# Patient Record
Sex: Male | Born: 1955 | Race: White | Hispanic: No | Marital: Single
Health system: Southern US, Community
[De-identification: ages and names within clinical notes are randomized; demographics above are authoritative.]

---

## 2008-03-10 ENCOUNTER — Emergency Department: Payer: Self-pay | Admitting: Emergency Medicine

## 2009-08-07 ENCOUNTER — Emergency Department: Payer: Self-pay | Admitting: Emergency Medicine

## 2009-08-23 ENCOUNTER — Emergency Department: Payer: Self-pay | Admitting: Emergency Medicine

## 2010-05-17 ENCOUNTER — Ambulatory Visit: Payer: Self-pay | Admitting: Family Medicine

## 2010-08-16 ENCOUNTER — Ambulatory Visit: Payer: Self-pay | Admitting: Family Medicine

## 2012-07-02 ENCOUNTER — Ambulatory Visit: Payer: Self-pay | Admitting: Family Medicine

## 2012-07-24 ENCOUNTER — Ambulatory Visit: Payer: Self-pay | Admitting: Specialist

## 2012-08-06 ENCOUNTER — Ambulatory Visit: Payer: Self-pay | Admitting: Specialist

## 2012-08-06 LAB — PROTIME-INR: Prothrombin Time: 13.3 secs (ref 11.5–14.7)

## 2012-08-06 LAB — PLATELET COUNT: Platelet: 513 10*3/uL — ABNORMAL HIGH (ref 150–440)

## 2012-08-07 LAB — PATHOLOGY REPORT

## 2012-08-17 ENCOUNTER — Ambulatory Visit: Payer: Self-pay | Admitting: Internal Medicine

## 2012-08-27 ENCOUNTER — Ambulatory Visit: Payer: Self-pay | Admitting: Hematology and Oncology

## 2012-08-28 ENCOUNTER — Ambulatory Visit: Payer: Self-pay | Admitting: Hematology and Oncology

## 2012-09-02 ENCOUNTER — Ambulatory Visit: Payer: Self-pay | Admitting: Hematology and Oncology

## 2012-09-09 LAB — COMPREHENSIVE METABOLIC PANEL
Alkaline Phosphatase: 118 U/L (ref 50–136)
Anion Gap: 7 (ref 7–16)
BUN: 16 mg/dL (ref 7–18)
Bilirubin,Total: 0.3 mg/dL (ref 0.2–1.0)
Calcium, Total: 9.2 mg/dL (ref 8.5–10.1)
Co2: 29 mmol/L (ref 21–32)
Creatinine: 1.07 mg/dL (ref 0.60–1.30)
EGFR (African American): >60
EGFR (Non-African Amer.): >60
Glucose: 98 mg/dL (ref 65–99)
Osmolality: 275 (ref 275–301)
Sodium: 137 mmol/L (ref 136–145)
Total Protein: 7.9 g/dL (ref 6.4–8.2)

## 2012-09-09 LAB — CBC CANCER CENTER
Eosinophil #: 0.1 x10 3/mm (ref 0.0–0.7)
Eosinophil %: 0.6 %
HCT: 29.7 % — ABNORMAL LOW (ref 40.0–52.0)
HGB: 10 g/dL — ABNORMAL LOW (ref 13.0–18.0)
Lymphocyte #: 1.1 x10 3/mm (ref 1.0–3.6)
MCH: 26.4 pg (ref 26.0–34.0)
MCV: 78 fL — ABNORMAL LOW (ref 80–100)
Monocyte %: 12.5 %
Neutrophil #: 8 x10 3/mm — ABNORMAL HIGH (ref 1.4–6.5)
Neutrophil %: 75.4 %
RDW: 19.2 % — ABNORMAL HIGH (ref 11.5–14.5)
WBC: 10.6 x10 3/mm (ref 3.8–10.6)

## 2012-09-16 LAB — CBC CANCER CENTER
Basophil %: 0.9 %
Eosinophil #: 0.1 x10 3/mm (ref 0.0–0.7)
HCT: 32.2 % — ABNORMAL LOW (ref 40.0–52.0)
HGB: 10.6 g/dL — ABNORMAL LOW (ref 13.0–18.0)
Lymphocyte %: 14.6 %
MCH: 26 pg (ref 26.0–34.0)
MCV: 79 fL — ABNORMAL LOW (ref 80–100)
Monocyte #: 1.2 x10 3/mm — ABNORMAL HIGH (ref 0.2–1.0)
Monocyte %: 13.1 %
Platelet: 552 x10 3/mm — ABNORMAL HIGH (ref 150–440)
RBC: 4.07 10*6/uL — ABNORMAL LOW (ref 4.40–5.90)

## 2012-09-16 LAB — BASIC METABOLIC PANEL
BUN: 15 mg/dL (ref 7–18)
Calcium, Total: 9 mg/dL (ref 8.5–10.1)
Co2: 30 mmol/L (ref 21–32)
Creatinine: 1.02 mg/dL (ref 0.60–1.30)
EGFR (Non-African Amer.): >60
Glucose: 80 mg/dL (ref 65–99)
Osmolality: 277 (ref 275–301)
Potassium: 3.8 mmol/L (ref 3.5–5.1)

## 2012-09-18 ENCOUNTER — Ambulatory Visit: Payer: Self-pay | Admitting: Hematology and Oncology

## 2012-09-23 LAB — CBC CANCER CENTER
Basophil #: 0.1 x10 3/mm (ref 0.0–0.1)
Basophil %: 1.1 %
Eosinophil %: 2.5 %
HGB: 10.5 g/dL — ABNORMAL LOW (ref 13.0–18.0)
Lymphocyte %: 12.3 %
MCH: 26.6 pg (ref 26.0–34.0)
MCHC: 33.4 g/dL (ref 32.0–36.0)
Monocyte %: 6.3 %
Neutrophil #: 4.3 x10 3/mm (ref 1.4–6.5)
Neutrophil %: 77.8 %
RBC: 3.93 10*6/uL — ABNORMAL LOW (ref 4.40–5.90)

## 2012-09-30 LAB — HEPATIC FUNCTION PANEL A (ARMC)
Albumin: 2.8 g/dL — ABNORMAL LOW (ref 3.4–5.0)
Alkaline Phosphatase: 130 U/L (ref 50–136)
Bilirubin, Direct: 0.1 mg/dL (ref 0.00–0.20)
Bilirubin,Total: 0.3 mg/dL (ref 0.2–1.0)

## 2012-09-30 LAB — BASIC METABOLIC PANEL
Anion Gap: 9 (ref 7–16)
BUN: 21 mg/dL — ABNORMAL HIGH (ref 7–18)
Calcium, Total: 8.9 mg/dL (ref 8.5–10.1)
Chloride: 96 mmol/L — ABNORMAL LOW (ref 98–107)
Co2: 28 mmol/L (ref 21–32)
Creatinine: 1.1 mg/dL (ref 0.60–1.30)
EGFR (African American): >60
Glucose: 109 mg/dL — ABNORMAL HIGH (ref 65–99)
Osmolality: 270 (ref 275–301)
Potassium: 4.2 mmol/L (ref 3.5–5.1)
Sodium: 133 mmol/L — ABNORMAL LOW (ref 136–145)

## 2012-09-30 LAB — CBC CANCER CENTER
Basophil #: 0 x10 3/mm (ref 0.0–0.1)
Eosinophil %: 1.4 %
HCT: 31.3 % — ABNORMAL LOW (ref 40.0–52.0)
MCH: 26.6 pg (ref 26.0–34.0)
MCHC: 33.3 g/dL (ref 32.0–36.0)
MCV: 80 fL (ref 80–100)
Monocyte #: 0.7 x10 3/mm (ref 0.2–1.0)
Monocyte %: 17 %
Neutrophil %: 73.6 %
Platelet: 359 x10 3/mm (ref 150–440)
RBC: 3.91 10*6/uL — ABNORMAL LOW (ref 4.40–5.90)
RDW: 18.3 % — ABNORMAL HIGH (ref 11.5–14.5)
WBC: 4.2 x10 3/mm (ref 3.8–10.6)

## 2012-10-07 LAB — CBC CANCER CENTER
Basophil #: 0 x10 3/mm (ref 0.0–0.1)
Lymphocyte %: 6.7 %
Monocyte #: 0.4 x10 3/mm (ref 0.2–1.0)
Monocyte %: 10.4 %
Neutrophil %: 81.2 %
RDW: 18.7 % — ABNORMAL HIGH (ref 11.5–14.5)
WBC: 3.9 x10 3/mm (ref 3.8–10.6)

## 2012-10-07 LAB — BASIC METABOLIC PANEL
Calcium, Total: 9.3 mg/dL (ref 8.5–10.1)
Chloride: 98 mmol/L (ref 98–107)
Creatinine: 0.94 mg/dL (ref 0.60–1.30)
EGFR (African American): >60
EGFR (Non-African Amer.): >60
Glucose: 113 mg/dL — ABNORMAL HIGH (ref 65–99)
Osmolality: 275 (ref 275–301)
Potassium: 4.3 mmol/L (ref 3.5–5.1)

## 2012-10-14 LAB — CBC CANCER CENTER
Basophil #: 0 x10 3/mm (ref 0.0–0.1)
Basophil %: 1.1 %
Eosinophil #: 0.1 x10 3/mm (ref 0.0–0.7)
Eosinophil %: 1.2 %
HCT: 31 % — ABNORMAL LOW (ref 40.0–52.0)
HGB: 10.3 g/dL — ABNORMAL LOW (ref 13.0–18.0)
MCH: 27 pg (ref 26.0–34.0)
MCHC: 33.3 g/dL (ref 32.0–36.0)
MCV: 81 fL (ref 80–100)
Monocyte %: 18.5 %
Neutrophil #: 3.2 x10 3/mm (ref 1.4–6.5)
Neutrophil %: 73.2 %
Platelet: 340 x10 3/mm (ref 150–440)
RBC: 3.83 10*6/uL — ABNORMAL LOW (ref 4.40–5.90)
RDW: 19.9 % — ABNORMAL HIGH (ref 11.5–14.5)
WBC: 4.3 x10 3/mm (ref 3.8–10.6)

## 2012-10-14 LAB — BASIC METABOLIC PANEL
Anion Gap: 9 (ref 7–16)
BUN: 14 mg/dL (ref 7–18)
Calcium, Total: 8.5 mg/dL (ref 8.5–10.1)
Chloride: 98 mmol/L (ref 98–107)
Co2: 30 mmol/L (ref 21–32)
Creatinine: 0.98 mg/dL (ref 0.60–1.30)
EGFR (African American): >60
Glucose: 79 mg/dL (ref 65–99)
Potassium: 3.8 mmol/L (ref 3.5–5.1)

## 2012-10-19 ENCOUNTER — Ambulatory Visit: Payer: Self-pay | Admitting: Hematology and Oncology

## 2012-10-21 ENCOUNTER — Ambulatory Visit: Payer: Self-pay | Admitting: Hematology and Oncology

## 2012-10-21 LAB — BASIC METABOLIC PANEL
BUN: 19 mg/dL — ABNORMAL HIGH (ref 7–18)
Chloride: 98 mmol/L (ref 98–107)
Co2: 31 mmol/L (ref 21–32)
EGFR (African American): >60
EGFR (Non-African Amer.): >60
Glucose: 108 mg/dL — ABNORMAL HIGH (ref 65–99)
Osmolality: 275 (ref 275–301)
Potassium: 4.1 mmol/L (ref 3.5–5.1)

## 2012-10-21 LAB — CBC CANCER CENTER
Basophil #: 0 x10 3/mm (ref 0.0–0.1)
Basophil %: 0.7 %
HCT: 35.2 % — ABNORMAL LOW (ref 40.0–52.0)
HGB: 11.6 g/dL — ABNORMAL LOW (ref 13.0–18.0)
Lymphocyte #: 0.3 x10 3/mm — ABNORMAL LOW (ref 1.0–3.6)
Lymphocyte %: 5.7 %
MCH: 26.8 pg (ref 26.0–34.0)
MCHC: 32.9 g/dL (ref 32.0–36.0)
Monocyte #: 0.7 x10 3/mm (ref 0.2–1.0)
Monocyte %: 13.8 %
Neutrophil #: 3.9 x10 3/mm (ref 1.4–6.5)
Neutrophil %: 78.8 %
Platelet: 351 x10 3/mm (ref 150–440)
RBC: 4.32 10*6/uL — ABNORMAL LOW (ref 4.40–5.90)
WBC: 5 x10 3/mm (ref 3.8–10.6)

## 2012-10-28 LAB — BASIC METABOLIC PANEL
BUN: 16 mg/dL (ref 7–18)
Calcium, Total: 9 mg/dL (ref 8.5–10.1)
Chloride: 97 mmol/L — ABNORMAL LOW (ref 98–107)
Creatinine: 1.07 mg/dL (ref 0.60–1.30)
EGFR (African American): >60
Glucose: 117 mg/dL — ABNORMAL HIGH (ref 65–99)
Sodium: 133 mmol/L — ABNORMAL LOW (ref 136–145)

## 2012-10-28 LAB — CBC CANCER CENTER
Eosinophil #: 0 x10 3/mm (ref 0.0–0.7)
Lymphocyte #: 0.2 x10 3/mm — ABNORMAL LOW (ref 1.0–3.6)
Lymphocyte %: 6.3 %
Monocyte #: 0.4 x10 3/mm (ref 0.2–1.0)
Neutrophil #: 2 x10 3/mm (ref 1.4–6.5)
Neutrophil %: 74.5 %
WBC: 2.6 x10 3/mm — ABNORMAL LOW (ref 3.8–10.6)

## 2012-11-04 LAB — CBC CANCER CENTER
HCT: 28.6 % — ABNORMAL LOW (ref 40.0–52.0)
HGB: 9.4 g/dL — ABNORMAL LOW (ref 13.0–18.0)
Lymphocyte #: 0.2 x10 3/mm — ABNORMAL LOW (ref 1.0–3.6)
Lymphocyte %: 9 %
MCHC: 32.9 g/dL (ref 32.0–36.0)
MCV: 84 fL (ref 80–100)
Neutrophil #: 1.4 x10 3/mm (ref 1.4–6.5)
Neutrophil %: 67 %
Platelet: 298 x10 3/mm (ref 150–440)
RBC: 3.41 10*6/uL — ABNORMAL LOW (ref 4.40–5.90)
RDW: 21.8 % — ABNORMAL HIGH (ref 11.5–14.5)

## 2012-11-04 LAB — BASIC METABOLIC PANEL
Calcium, Total: 8.6 mg/dL (ref 8.5–10.1)
Chloride: 97 mmol/L — ABNORMAL LOW (ref 98–107)
Co2: 30 mmol/L (ref 21–32)
EGFR (African American): >60
Glucose: 106 mg/dL — ABNORMAL HIGH (ref 65–99)
Sodium: 134 mmol/L — ABNORMAL LOW (ref 136–145)

## 2012-11-13 ENCOUNTER — Emergency Department: Payer: Self-pay | Admitting: Emergency Medicine

## 2012-11-18 ENCOUNTER — Ambulatory Visit: Payer: Self-pay | Admitting: Hematology and Oncology

## 2012-11-18 DEATH — deceased

## 2014-06-10 NOTE — Consult Note (Signed)
Reason for Visit: This 59 year old Male patient presents to the clinic for initial evaluation of  lung cancer .   Referred by Dr.Ramiah.  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  Pathology Report pathology report reviewed   Imaging Report PET/CT scan and CT scans reviewed   Referral Report clinical notes reviewed   Planned Treatment Regimen concurrent chemotherapy and radiation therapy   HPI   patient is a 59 year old male resident of the group home based on schizophrenia and mild mental retardation whose been followed for left lower lobe pneumonia for some time. Recent chest x-ray showed a 7.5 cm left lower lobe mass PET CT was performed showing avid uptake with consolidation of the mid and left lower lobe. Bronchoscopy was performed as well as fine-needle aspirate which was positive for squamous cell carcinoma. He's been evaluated by medical oncology and is now referred to radiation oncology for opinion. He does have been nonproductive cough. He specifically denies hemoptysis. He is having no bone pain.he continues to smoke although he is down to 2 cigarettes per day.  Past Hx:    Squamous cell carcinoma of lung:    COPD:    MR:    Schizophrenia:   Past, Family and Social History:  Past Medical History positive   Respiratory COPD   Neurological/Psychiatric moderate mental retardation, schizophrenia   Family History noncontributory   Social History positive   Social History Comments greater than 40 pack is smoking history although is only down to 2 cigarettes per day. No EtOH abuse history   Additional Past Medical and Surgical History accompanied bymember of his group home as well as to family members today   Allergies:   No Known Allergies:   Home Meds:  Home Medications: Medication Instructions Status  propranolol 20 mg oral tablet 1   orally 3 times a day Active  lorazepam 1 mg oral tablet 1   3 times a day Active  ProAir HFA CFC free 90 mcg/inh inhalation aerosol 2 puff(s) inhaled 4 times a day, As Needed Active  Colace sodium 100 mg oral capsule 1 cap(s) orally 2 times a day Active  benztropine 1 mg oral tablet 1 tab(s) orally 2 times a day Active  fluPHENAZine 2.5 mg/mL injectable solution 25 milligram(s) injectable every 2 weeks Active  mirtazapine 15 mg oral tablet 1 tab(s) orally once a day (at bedtime) Active  SEROquel 400 mg oral tablet 1 tab(s) orally once a day (at bedtime) Active  ibuprofen 600 mg oral tablet 1 tab(s) orally 3 times a day Active  Spiriva 18 mcg inhalation capsule 1 each inhaled once a day Active  fluticasone nasal 50 mcg/inh nasal spray 1 spray(s) nasal once a day Active  cetirizine 10 mg oral tablet 1 tab(s) orally once a day Active   Review of Systems:  General negative   Performance Status (ECOG) 0   Skin negative   Breast negative   Ophthalmologic negative   ENMT negative   Respiratory and Thorax see HPI   Cardiovascular negative   Gastrointestinal negative   Genitourinary negative   Musculoskeletal negative   Neurological negative   Psychiatric negative   Hematology/Lymphatics negative   Endocrine negative   Allergic/Immunologic negative   Nursing Notes:  Nursing Vital Signs and Chemo Nursing Nursing Notes: *CC Vital Signs Flowsheet:   17-Jul-14 14:41  Temp Temperature 97  Pulse Pulse 85  Respirations Respirations 21  SBP SBP 110  DBP DBP 71  Current Weight (kg) (kg) 49.6  Height (cm) centimeters 170.5  BSA (m2) 1.5   Physical Exam:  General/Skin/HEENT:  Skin normal   Eyes normal   ENMT normal   Head and Neck normal   Additional PE well-developed thin male in NAD appears to have mild mental retardation.cervical or subclavicular adenopathy is appreciated. Lungs are clear to A&P cardiac examination shows regular rate and rhythm. Abdomen is benign  with no organomegaly or masses noted.no peripheral adenopathy or edema is identified. Has some mild scattered expiratory wheezes more accentuated in the left lung base.   Breasts/Resp/CV/GI/GU:  Breasts normal   Cardiovascular normal   Gastrointestinal normal   Genitourinary normal   MS/Neuro/Psych/Lymph:  Musculoskeletal normal   Neurological normal   Lymphatics normal   Other Results:  Radiology Results: LabUnknown:    06-Jun-14 10:47, PET/CT Scan Lung Cancer Diagnosis  PACS Image     16-Jul-14 14:36, CT Head WWO Contrast  PACS Image   CT:  CT Head WWO Contrast   REASON FOR EXAM:    staging with lung CA  COMMENTS:       PROCEDURE: KCT - KCT HEAD W/WO CONTRAST  - Sep 02 2012  2:36PM     RESULT: CT of the brain without and with contrast is formed utilizing 50   mL of Isovue-300 iodinated intravenous contrast. There is no previous   exam for comparison.    The noncontrast images show the ventricles and sulci are normal. There is   no intracranial hemorrhage, mass effect or midline shift. No territorial   infarct is evident. The calvarium are intact without adepressed   fracture. The sinuses are clear. There is no abnormal enhancement   following intravenous administration of contrast.  IMPRESSION:  No focal intracranial abnormality.    Dictation Site: 1        Verified By: Elveria Royals, M.D., MD  Nuclear Med:    06-Jun-14 10:47, PET/CT Scan Lung Cancer Diagnosis  PET/CT Scan Lung Cancer Diagnosis   REASON FOR EXAM:    lung mass  COMMENTS:       PROCEDURE: PET - PET/CT DX LUNG CA  - Jul 24 2012 10:47AM     RESULT: The patient is undergoing evaluation of a left upper lobe mass.   The patient's fasting blood glucose level is 78 mg/dL. The patient   received 12.98 mCi of F-18 labeled FDG at 9:15 a.m. with scanning   beginning at 10:22 a.m. A noncontrast CT scan was performed at the same   sitting for coregistration and attenuation correction.    Uptake  of the radiopharmaceutical within the neckis within the limits of   normal with the exception of a low-grade focal area of increased uptake   measuring 2.4 SUV units maximally with a mean of 1.3. This lies in the   posterior lateral soft tissues just anterior to the lateral mass of C2.  Within the thorax there is intensely increased uptake within the soft   tissues of the inferior aspect of the left upper lobe and within portions   of the left lower lobe which are now atelectatic. There is a 5.1 cm   diameter photopenic area consistent with loculated fluid or necrotic   nonviable lung. The maximal SUV here is 16.6 with a mean of 9.7. Low   level uptake in a ringlike fashion is seen surrounding this presumed   fluid or necrotic  region. I do not see abnormal uptake elsewhere within   the thorax.    Within the abdomen and pelvis there is normal expected uptake within the   kidneys and ureters. There is a moderate amount of increased uptake   associated with the right colon. There is a focus of increased uptake in   the leftt lateralaspect of the true pelvis medial to the inner aspect of   the left acetabulum which could lie in the distal ureter, but I cannot   exclude a hypermetabolic normal sized lymph node. It exhibits maximal SUV     of 18.6 with a mean of 11.3.    On the CTimages there has been progression in the consolidation of the   mid and lower left hemithorax. The right lung is clear. Within the   abdomen there is likely a parapelvic cyst in the right kidney. The liver   exhibits no focal mass. The spleen is not enlarged. The caliber of the   abdominal aorta is normal. There is considerable gas within bowel loops   which results in artifact. Within the pelvis there is free fluid. The   prostate gland is enlarged and produces an impression upon the urinary   bladder base. There is a subtle soft tissue density structure   demonstrated to the left in the urinary bladder lying along  the medial   aspect of the acetabulum demonstrated best on image [id number] which may reflect   a lymph node measuring 1 x 1.5 cm whichcorresponds to the hypermetabolic   focus.  IMPRESSION:   1. There is intensely increased uptake of the radiopharmaceutical within   the inferior aspect of the left upper lobe as well as in a ringlike   fashion surrounding the atelectatic and possibly necrotic left lower   lobe. The findings are suspicious for malignancy. An infectious process   cannot be absolutely excluded. Tissue diagnosis will be needed.  2. There is low level increased uptake within a normal size lymph node or   within the lateral mass of approximately C2 on the left. This is   nonspecific  3. There is increased uptake within a presumed normal sized lymph node to   the left of the urinary bladder in the pelvis which may reflect   malignancy.  4. There is free fluid in the pelvis.     Dictation Site: 2    Verified By: DAVID A. [place], M.D., MD   Relevent Results:   Relevant Scans and Labs PET/CT CT scans and CT scan of head or all reviewed.   Assessment and Plan: Impression:   stage IIIB squamous carcinoma the left lower lobe in 59 year old male with mild mental retardation and schizophrenia. Plan:   patient does appear propria to be able to tolerate radiation therapy. I've recommended going ahead with a split course of radiation. Would start over the course of 4000 cGy to large field chest with concurrent chemotherapy. After two-week break would be rather reevaluate the patient for patient tolerance as well as tumor response. Risks and benefits of treatment have been explained to the patient and his family. Side effects including some possible dysphasia, increasing cough, skin reaction, fatigue, and skin changes were all explained in detail to the patient. I have set him up for CT simulation early next week.  I would like to take this opportunity to thank you for allowing me to continue  to participate in this patient's care.  CC Referral:  cc: Dr. Ezekiel Slocumb  Electronic Signatures: Rebeca Alerthrystal, Junior Huezo S (MD)  (Signed 17-Jul-14 15:18)  Authored: HPI, Diagnosis, Past Hx, PFSH, Allergies, Home Meds, ROS, Nursing Notes, Physical Exam, Other Results, Relevent Results, Encounter Assessment and Plan, CC Referring Physician   Last Updated: 17-Jul-14 15:18 by Rebeca Alerthrystal, Shenouda Genova S (MD)

## 2015-05-18 IMAGING — CT CT HEAD WITHOUT AND WITH CONTRAST
1 of 2 series · 13 of 30 positions shown, 17 images · non-contrast
Comparison: none

REASON FOR EXAM: staging with lung CA
COMMENTS:

[Series 2: soft tissue · axial · 0.39mm/px · z∈[-71,+49]mm · 13 of 29 slices shown, 17 images]
[im 3/29  brain]
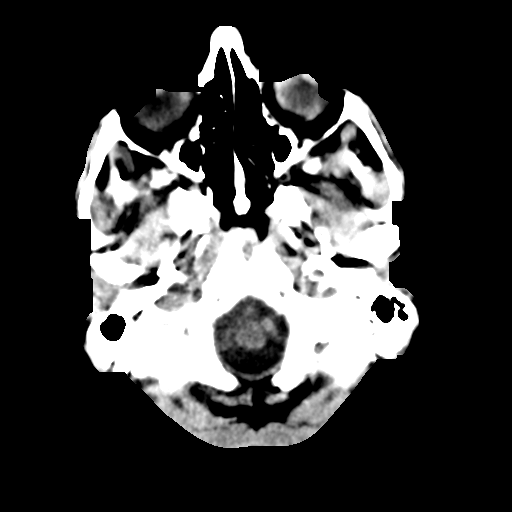
[im 3/29  bone]
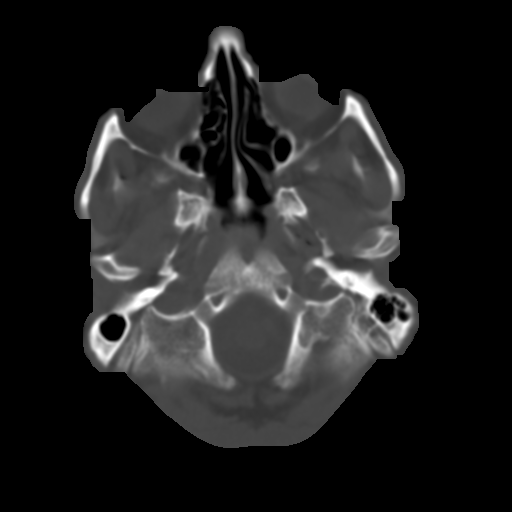
[im 5/29  brain]
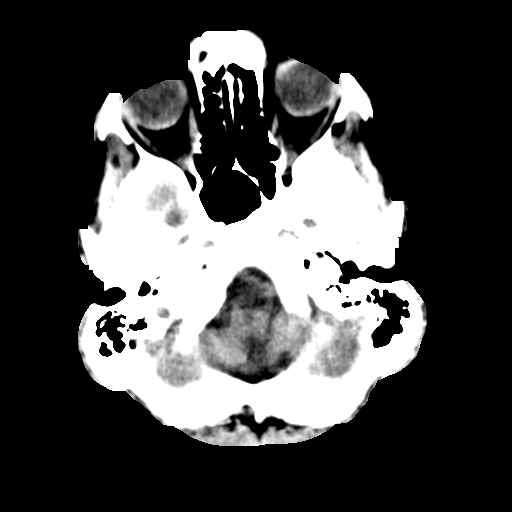
[im 7/29  brain]
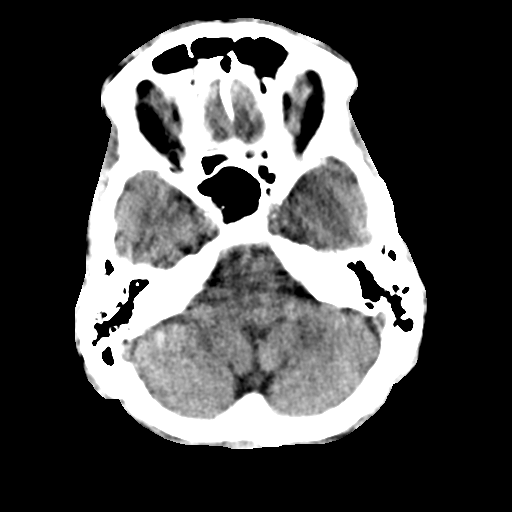
[im 9/29  brain]
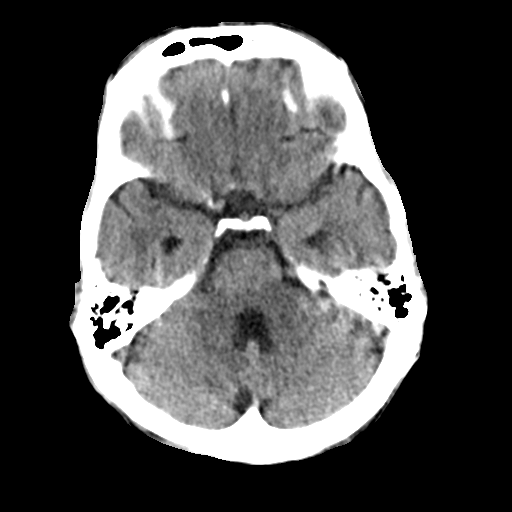
[im 11/29  brain]
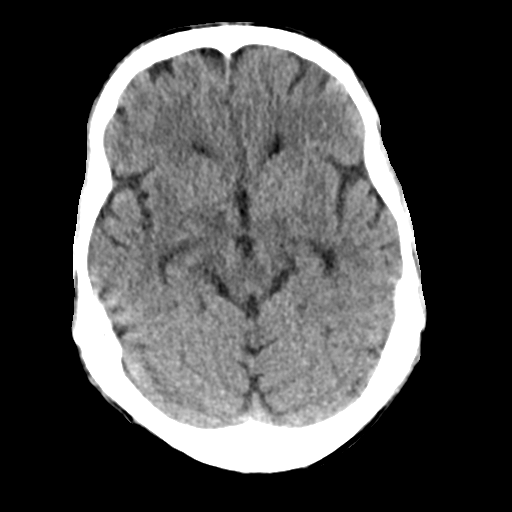
[im 11/29  bone]
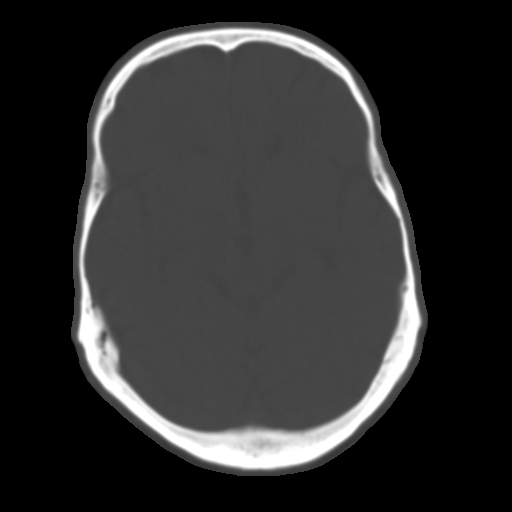
[im 13/29  brain]
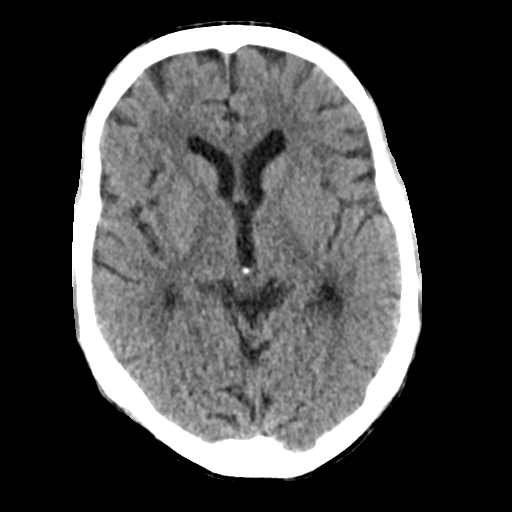
[im 15/29  brain]
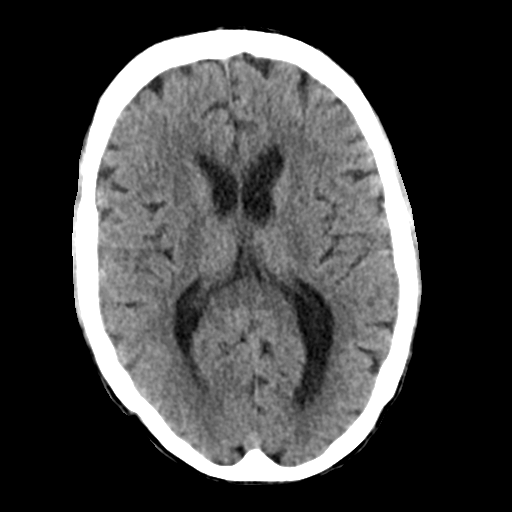
[im 17/29  brain]
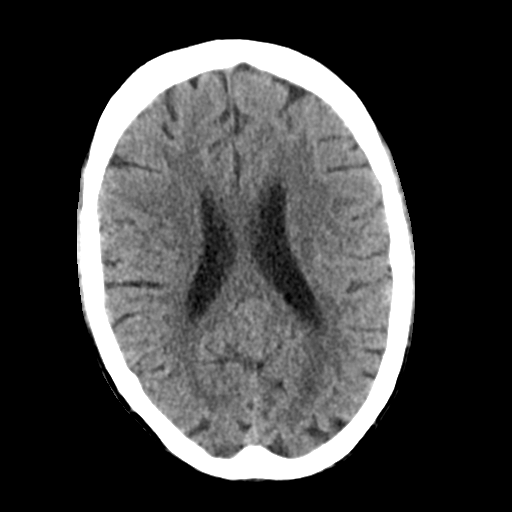
[im 19/29  brain]
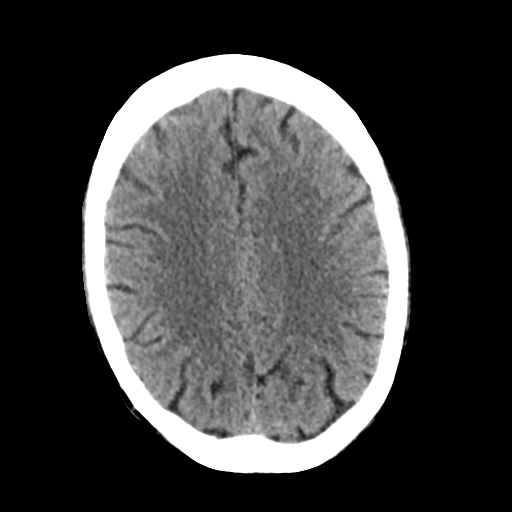
[im 19/29  bone]
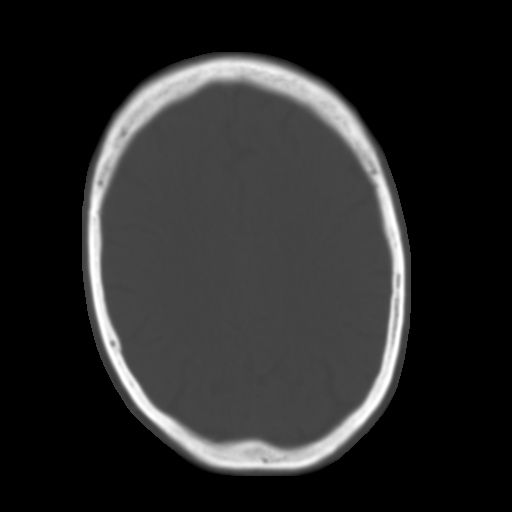
[im 21/29  brain]
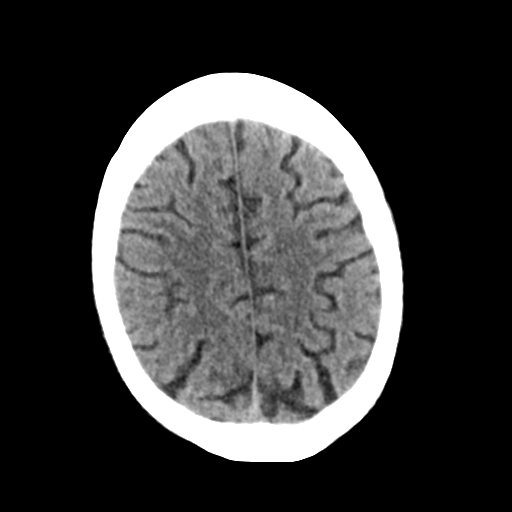
[im 23/29  brain]
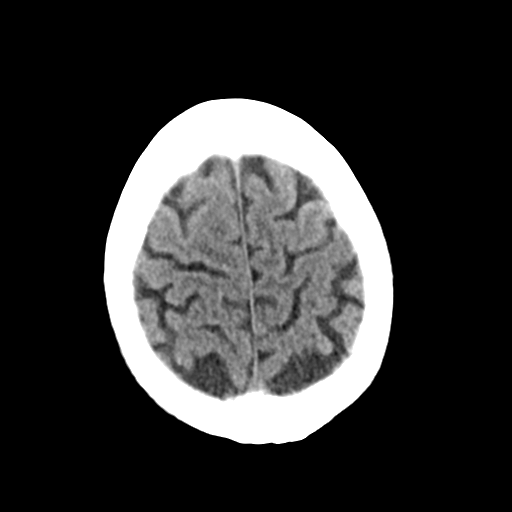
[im 25/29  brain]
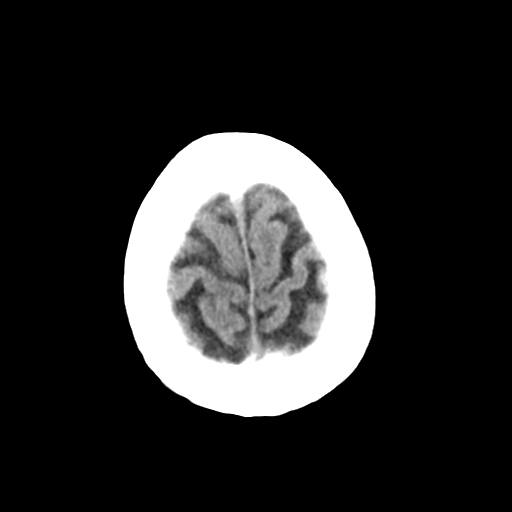
[im 27/29  brain]
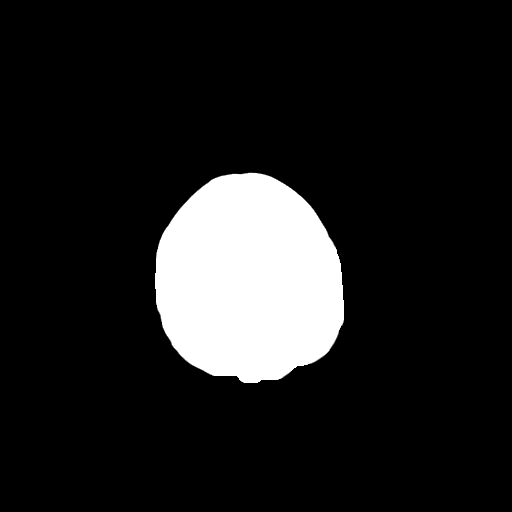
[im 27/29  bone]
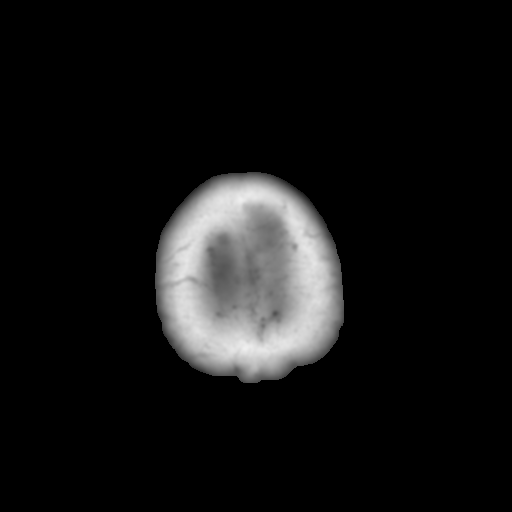

[13 of 30 positions shown; findings below may reference images not displayed]

PROCEDURE:     KCT - KCT HEAD W/WO CONTRAST  - September 02, 2012  [DATE]

RESULT:     CT of the brain without and with contrast is formed utilizing 50
mL of Lsovue-D99 iodinated intravenous contrast. There is no previous exam
for comparison.

The noncontrast images show the ventricles and sulci are normal. There is no
intracranial hemorrhage, mass effect or midline shift. No territorial
infarct is evident. The calvarium are intact without a depressed fracture.
The sinuses are clear. There is no abnormal enhancement following
intravenous administration of contrast.
IMPRESSION: No focal intracranial abnormality.

[REDACTED]
